# Patient Record
Sex: Male | Born: 1965 | Race: Black or African American | Hispanic: No | Smoking: Former smoker
Health system: Southern US, Community
[De-identification: ages and names within clinical notes are randomized; demographics above are authoritative.]

## PROBLEM LIST (undated history)

## (undated) DIAGNOSIS — G4733 Obstructive sleep apnea (adult) (pediatric): Secondary | ICD-10-CM

## (undated) DIAGNOSIS — I10 Essential (primary) hypertension: Secondary | ICD-10-CM

## (undated) DIAGNOSIS — I639 Cerebral infarction, unspecified: Secondary | ICD-10-CM

## (undated) HISTORY — DX: Obstructive sleep apnea (adult) (pediatric): G47.33

## (undated) HISTORY — DX: Essential (primary) hypertension: I10

## (undated) HISTORY — DX: Cerebral infarction, unspecified: I63.9

---

## 2015-01-28 ENCOUNTER — Ambulatory Visit (INDEPENDENT_AMBULATORY_CARE_PROVIDER_SITE_OTHER): Payer: Non-veteran care | Admitting: Internal Medicine

## 2015-01-28 ENCOUNTER — Ambulatory Visit (INDEPENDENT_AMBULATORY_CARE_PROVIDER_SITE_OTHER)
Admission: RE | Admit: 2015-01-28 | Discharge: 2015-01-28 | Disposition: A | Discharge status: Home / Self Care | Payer: Self-pay | Source: Ambulatory Visit | Attending: Internal Medicine | Admitting: Internal Medicine

## 2015-01-28 ENCOUNTER — Encounter: Payer: Self-pay | Admitting: Internal Medicine

## 2015-01-28 VITALS — BP 140/90 | HR 60 | Ht 69.5 in | Wt 192.2 lb

## 2015-01-28 DIAGNOSIS — D869 Sarcoidosis, unspecified: Secondary | ICD-10-CM | POA: Diagnosis not present

## 2015-01-28 DIAGNOSIS — R0600 Dyspnea, unspecified: Secondary | ICD-10-CM

## 2015-01-28 MED ORDER — PANTOPRAZOLE SODIUM 40 MG PO TBEC: 40.0000 mg | DELAYED_RELEASE_TABLET | Freq: Every day | ORAL | Status: AC

## 2015-01-28 MED ORDER — FAMOTIDINE 20 MG PO TABS: ORAL_TABLET | ORAL | Status: AC

## 2015-01-28 NOTE — Progress Notes (Signed)
Subjective:    Patient ID: Jay George, male    DOB: Oct 08, 1965,    MRN: [id number]  HPI  49 yobm quit smoking May 2016 with dx of sarcoid 1992 sob/rash never completely resolved on prednisone at maint of 20 mg  so stopped around early 2015 with nausea/vomiting which improved over a few weeks but then his breathing started getting worse > referred by [facility] to pulmonary clinic 01/28/2015 for ? Needs rehab   01/28/2015 1st [facility] office visit/ Jay George   Chief Complaint  Patient presents with  . Pulmonary Consult    [facility]. Pt states that he was dxed in 1992.  He states that he had been on pred since his dx until 1.5 yrs ago- since then having increased DOE. He is unable to play golf as much as he used to due to SOB.  He has prod cough with yellow sputum am only.    rash is not different on vs off prednisone / breathing is def worse. 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 No unusual exp hx or h/o childhood pna/ asthma or knowledge of premature birth.  Sleeping ok without nocturnal  or early am exacerbation  of respiratory  c/o's or need for noct saba. Also denies any obvious fluctuation of symptoms with weather or environmental changes or other aggravating or alleviating factors except as outlined above   Current Medications, Allergies, Complete Past Medical History, Past Surgical History, Family History, and Social History were reviewed in [employer] record.           Review of Systems  Constitutional: Positive for appetite change.  Negative for fever, chills, activity change and unexpected weight change.  HENT: Negative for congestion, dental problem, postnasal drip, rhinorrhea, sneezing, sore throat, trouble swallowing and voice change.   Eyes: Negative for visual disturbance.  Respiratory: Positive for shortness of breath. Negative for cough and choking.   Cardiovascular: Negative for chest pain and leg swelling.  Gastrointestinal: Negative for nausea, vomiting and abdominal pain.  Genitourinary: Negative for difficulty urinating.       Acid heartburn  Musculoskeletal: Negative for arthralgias.  Skin: Negative for rash.  Psychiatric/Behavioral: Negative for behavioral problems and confusion.       Objective:   Physical Exam  Wt Readings from Last 3 Encounters:  01/28/15 192 lb 3.2 oz (87.181 kg)    Vital signs reviewed   HEENT: nl dentition, turbinates, and orophanx. Nl external ear canals without cough reflex   NECK :  without JVD/Nodes/TM/ nl carotid upstrokes bilaterally   LUNGS: no acc muscle use, clear to A and P bilaterally without cough on insp or exp maneuvers   CV:  RRR  no s3 or murmur or increase in P2, no edema   ABD:  soft and nontender with nl excursion in the supine position. No bruits or organomegaly, bowel sounds nl  MS:  warm without deformities, calf tenderness, cyanosis or clubbing  SKIN: warm and dry / hypopigmented  macules over upper torso/ face spared   NEURO:  alert, approp, no deficits    CXR PA and Lateral:   01/28/2015 :     I personally reviewed images and agree with radiology impression as follows:    1. Nodular opacity in the retrosternal airspace on the lateral view. Recommend CT of the chest in view of the patient's history of smoking. 2. Somewhat nodular hila may be due to sarcoidosis. Again CT of the chest with IV contrast media would be helpful in further evaluation. 3. Moderate cardiomegaly with AICD lead.      Assessment & Plan:

## 2015-01-28 NOTE — Assessment & Plan Note (Signed)
01/28/2015  Walked RA x 3 laps @ 185 ft each stopped due to End of study, nl pace, no sob or desat    Symptoms are markedly disproportionate to objective findings and not clear this is a lung problem but pt does appear to have difficult airway management issues.  DDX of  difficult airways management all start with A and  include Adherence, Ace Inhibitors, Acid Reflux, Active Sinus Disease, Alpha 1 Antitripsin deficiency, Anxiety masquerading as Airways dz,  ABPA,  allergy(esp in young), Aspiration (esp in elderly), Adverse effects of meds,  Active smokers, A bunch of PE's (a small clot burden can't cause this syndrome unless there is already severe underlying pulm or vascular dz with poor reserve) plus two Bs  = Bronchiectasis and Beta blocker use..and one C= CHF  Adherence is always the initial "prime suspect" and is a multilayered concern that requires a "trust but verify" approach in every patient - starting with knowing how to use medications, especially inhalers, correctly, keeping up with refills and understanding the fundamental difference between maintenance and prns vs those medications only taken for a very short course and then stopped and not refilled.   ? Acid (or non-acid) GERD > always difficult to exclude as up to 75% of pts in some series report no assoc GI/ Heartburn symptoms> rec max (24h)  acid suppression and diet restrictions/ reviewed and instructions given in writing.  ? Allergy/ asthma component > not much variability to support, no better off cigs  ? Active smoking > denies since may 2016, strongly reinforced > [facility]  ? BB > Strongly prefer in this setting: Bystolic, the most beta -1  selective Beta blocker available in sample form, with bisoprolol the most selective generic choice  on the market.   ? chf > need last cards notes  I had an extended discussion with the patient reviewing all relevant studies completed to date and  lasting 37m    Each  maintenance medication was reviewed in detail including most importantly the difference between maintenance and prns and under what circumstances the prns are to be triggered using an action plan format that is not reflected in the computer generated alphabetically organized AVS.    Please see instructions for details which were reviewed in writing and the patient given a copy highlighting the part that I personally wrote and discussed at today's ov.

## 2015-01-28 NOTE — Assessment & Plan Note (Signed)
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

## 2015-01-28 NOTE — Patient Instructions (Addendum)
Fax the bloodwork report and pfts to [phone] and last cardiology note and cath report   Please remember to go to the x-ray department downstairs for your tests - we will call you with the results when they are available.  Pantoprazole (protonix) 40 mg   Take  30-60 min before first meal of the day and Pepcid (famotidine)  20 mg one @  bedtime until return to office - this is the best way to tell whether stomach acid is contributing to your problem.    GERD (REFLUX)  is an extremely common cause of respiratory symptoms just like yours , many times with no obvious heartburn at all.    It can be treated with medication, but also with lifestyle changes including elevation of the head of your bed (ideally with 6 inch  bed blocks),  Smoking cessation, avoidance of late meals, excessive alcohol, and avoid fatty foods, chocolate, peppermint, colas, red wine, and acidic juices such as orange juice.  NO MINT OR MENTHOL PRODUCTS SO NO COUGH DROPS  USE SUGARLESS CANDY INSTEAD (Jolley ranchers or Stover's or Life Savers) or even ice chips will also do - the key is to swallow to prevent all throat clearing. NO OIL BASED VITAMINS - use powdered substitutes.  Please schedule a follow up office visit in 4 weeks, sooner if needed

## 2015-02-24 ENCOUNTER — Ambulatory Visit: Payer: Non-veteran care | Admitting: Internal Medicine

## 2015-03-04 ENCOUNTER — Encounter: Payer: Self-pay | Admitting: Internal Medicine

## 2015-03-04 ENCOUNTER — Ambulatory Visit (INDEPENDENT_AMBULATORY_CARE_PROVIDER_SITE_OTHER): Payer: Non-veteran care | Admitting: Internal Medicine

## 2015-03-04 VITALS — BP 130/90 | HR 70 | Ht 69.0 in | Wt 185.0 lb

## 2015-03-04 DIAGNOSIS — D869 Sarcoidosis, unspecified: Secondary | ICD-10-CM | POA: Diagnosis not present

## 2015-03-04 DIAGNOSIS — R0600 Dyspnea, unspecified: Secondary | ICD-10-CM

## 2015-03-04 NOTE — Patient Instructions (Signed)
No change in medications  We need for you to bring to us   1) your last chest xray on a disc plus the last CT chest  2) your last lung function test 3) your last heart cath report   Please schedule a follow up office visit in 6 weeks, call sooner if needed

## 2015-03-04 NOTE — Assessment & Plan Note (Signed)
01/28/2015  Walked RA x 3 laps @ 185 ft each stopped due to End of study, nl pace, no sob or desat    Improved on rx for gerd with minimal need for rescue combivent    I reviewed the Fletcher curve with the patient that basically indicates  if you quit smoking when your best day FEV1 is still well preserved (as is apparently  the case here but [place] [facility] [place] [place])   it is highly unlikely you will progress to severe disease and informed the patient there was no medication on the market that has proven to alter the curve/ its downward trajectory  or the likelihood of progression of their disease.  Therefore stopping smoking and maintaining abstinence is the most important aspect of care, not choice of inhalers or for that matter, doctors.    I had an extended discussion with the patient reviewing all relevant studies completed to date and  lasting 15 to 20 minutes of a 25 minute visit    Each maintenance medication was reviewed in detail including most importantly the difference between maintenance and prns and under what circumstances the prns are to be triggered using an action plan format that is not reflected in the computer generated alphabetically organized AVS.    Please see instructions for details which were reviewed in writing and the patient given a copy highlighting the part that I personally wrote and discussed at today's ov.

## 2015-03-04 NOTE — Assessment & Plan Note (Signed)
Dx in 1992 - off steroids since spring 2015   [facility] and options are to [facility] with [facility] or bring the previous studies on a CD for comparison to our cxr > Discussed in detail all the  indications, usual  risks and alternatives  relative to the benefits with patient who agrees to proceed with conservative f/u as outlined

## 2015-03-04 NOTE — Progress Notes (Signed)
Subjective:    Patient ID: Jay George, male    DOB: 1965-10-02,    MRN: [id number]    Brief patient profile:  49 yobm quit smoking May 2016 with dx of sarcoid 1992 [place] [facility]  so stopped around early 2015 with nausea/vomiting which improved over a few weeks but then his breathing started getting worse > [facility] for ? Needs rehab   01/28/2015 1st [facility] office visit/ Jay George   Chief Complaint  Patient presents with  . Pulmonary Consult    [facility]. Pt states that he was dxed in 1992.  He states that he had been on pred since his dx until 1.5 yrs ago- since then having increased DOE. He is unable to play golf as much as he used to due to SOB.  He has prod cough with yellow sputum am only.    rash is not different on vs off prednisone / breathing is def worse. 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 Pantoprazole (protonix) 40 mg   Take  30-60 min before first meal of the day and Pepcid (famotidine)  20 mg one @  bedtime until return to office - this is the best way to tell whether stomach acid is contributing to your problem.   GERD diet    03/04/2015  f/u ov/Jay George re: sob/ cough better on gerd rx/ [facility] recs from [facility] Chief Complaint  Patient presents with  . Follow-up    Pt states breathing has improved some and not coughing as much.  No new co's  today.    no [facility] records yet in system  But pt much better, rarely needing combivent, mostly at work and not nocturnal  No obvious day to day or daytime variability or assoc excess/purulent mucus  or cp or chest tightness, subjective wheeze or overt sinus or hb symptoms. No unusual exp hx or h/o childhood pna/ asthma or knowledge of premature birth.  Sleeping ok without nocturnal  or early am exacerbation  of respiratory  c/o's or need for noct saba. Also denies any obvious fluctuation of symptoms with weather or environmental changes or other aggravating or alleviating factors except as outlined above   Current Medications, Allergies, Complete Past Medical History, Past Surgical History, Family History, and Social History were reviewed in [facility] electronic medical record.  ROS  The following are not active complaints unless bolded sore throat, dysphagia, dental problems, itching, sneezing,  nasal congestion or excess/ purulent secretions, ear ache,   fever, chills, sweats, unintended wt loss, classically pleuritic or exertional cp, hemoptysis,  orthopnea pnd or leg swelling, presyncope, palpitations, abdominal pain, anorexia, nausea, vomiting, diarrhea  or change in bowel or bladder habits, change in stools or urine, dysuria,hematuria,  rash, arthralgias, visual complaints, headache, numbness, weakness  or ataxia or problems with walking or coordination,  change in mood/affect or memory.                               Objective:   Physical Exam  amb bm nad  Wt Readings from Last 3 Encounters:  03/04/15 185 lb (83.915 kg)  01/28/15 192 lb 3.2 oz (87.181 kg)    Vital signs reviewed    HEENT: nl dentition, turbinates, and orophanx. Nl external ear canals without cough reflex   NECK :  without JVD/Nodes/TM/ nl carotid upstrokes bilaterally   LUNGS: no acc muscle use, clear to A and P bilaterally without cough on insp or exp maneuvers   CV:  RRR  no s3 or murmur or  increase in P2, no edema   ABD:  soft and nontender with nl excursion in the supine position. No bruits or organomegaly, bowel sounds nl  MS:  warm without deformities, calf tenderness, cyanosis or clubbing  SKIN: warm and dry / hypopigmented macules over upper torso/ face spared   NEURO:  alert, approp, no deficits    CXR PA and Lateral:   01/28/2015 :     I personally reviewed images and agree with radiology impression as follows:    1. Nodular opacity in the retrosternal airspace on the lateral view. Recommend CT of the chest in view of the patient's history of smoking. 2. Somewhat nodular hila may be due to sarcoidosis. Again CT of the chest with IV contrast media would be helpful in further evaluation. 3. Moderate cardiomegaly with AICD lead.      Assessment & Plan:

## 2015-12-23 IMAGING — CR DG CHEST 2V
2 series · 2 of 2 positions shown · non-contrast
Comparison: None.

CLINICAL DATA: Shortness of breath, history of sarcoidosis, former
smoking history

EXAM:
CHEST  2 VIEW

[view not recorded (1 of 2)]
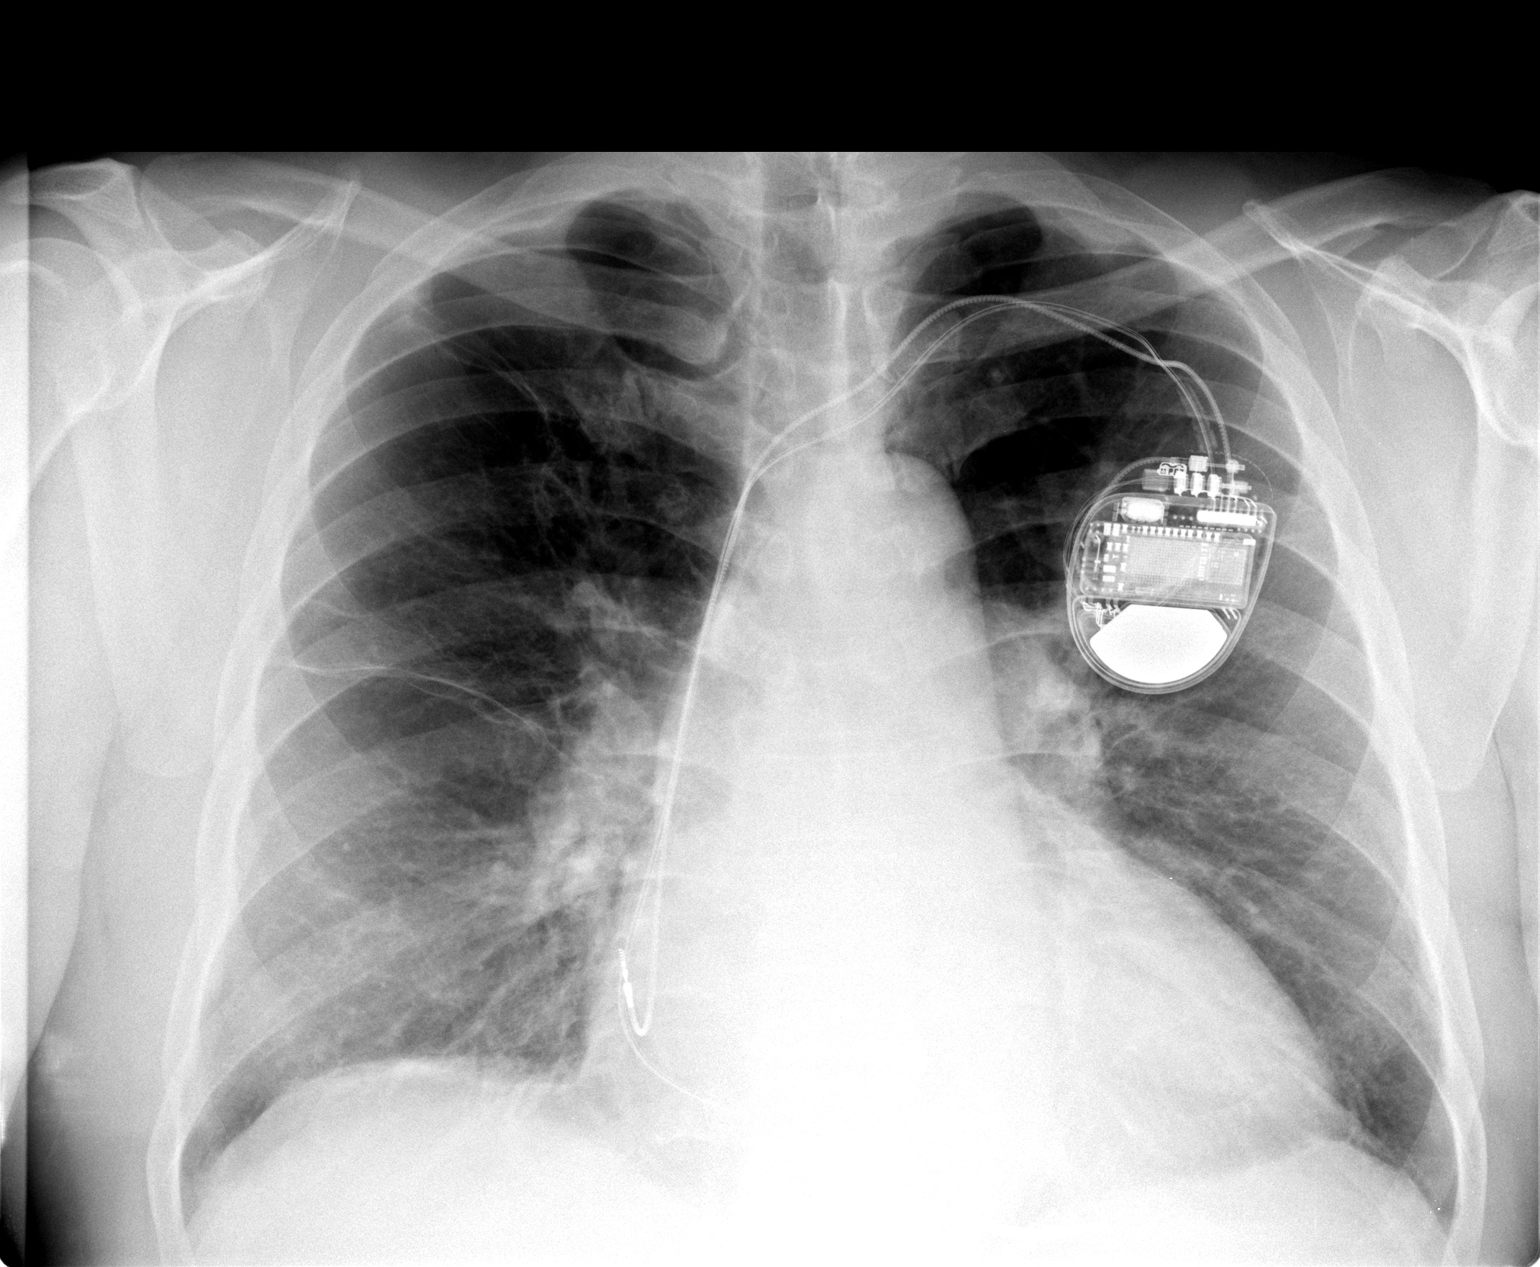

[view not recorded (2 of 2)]
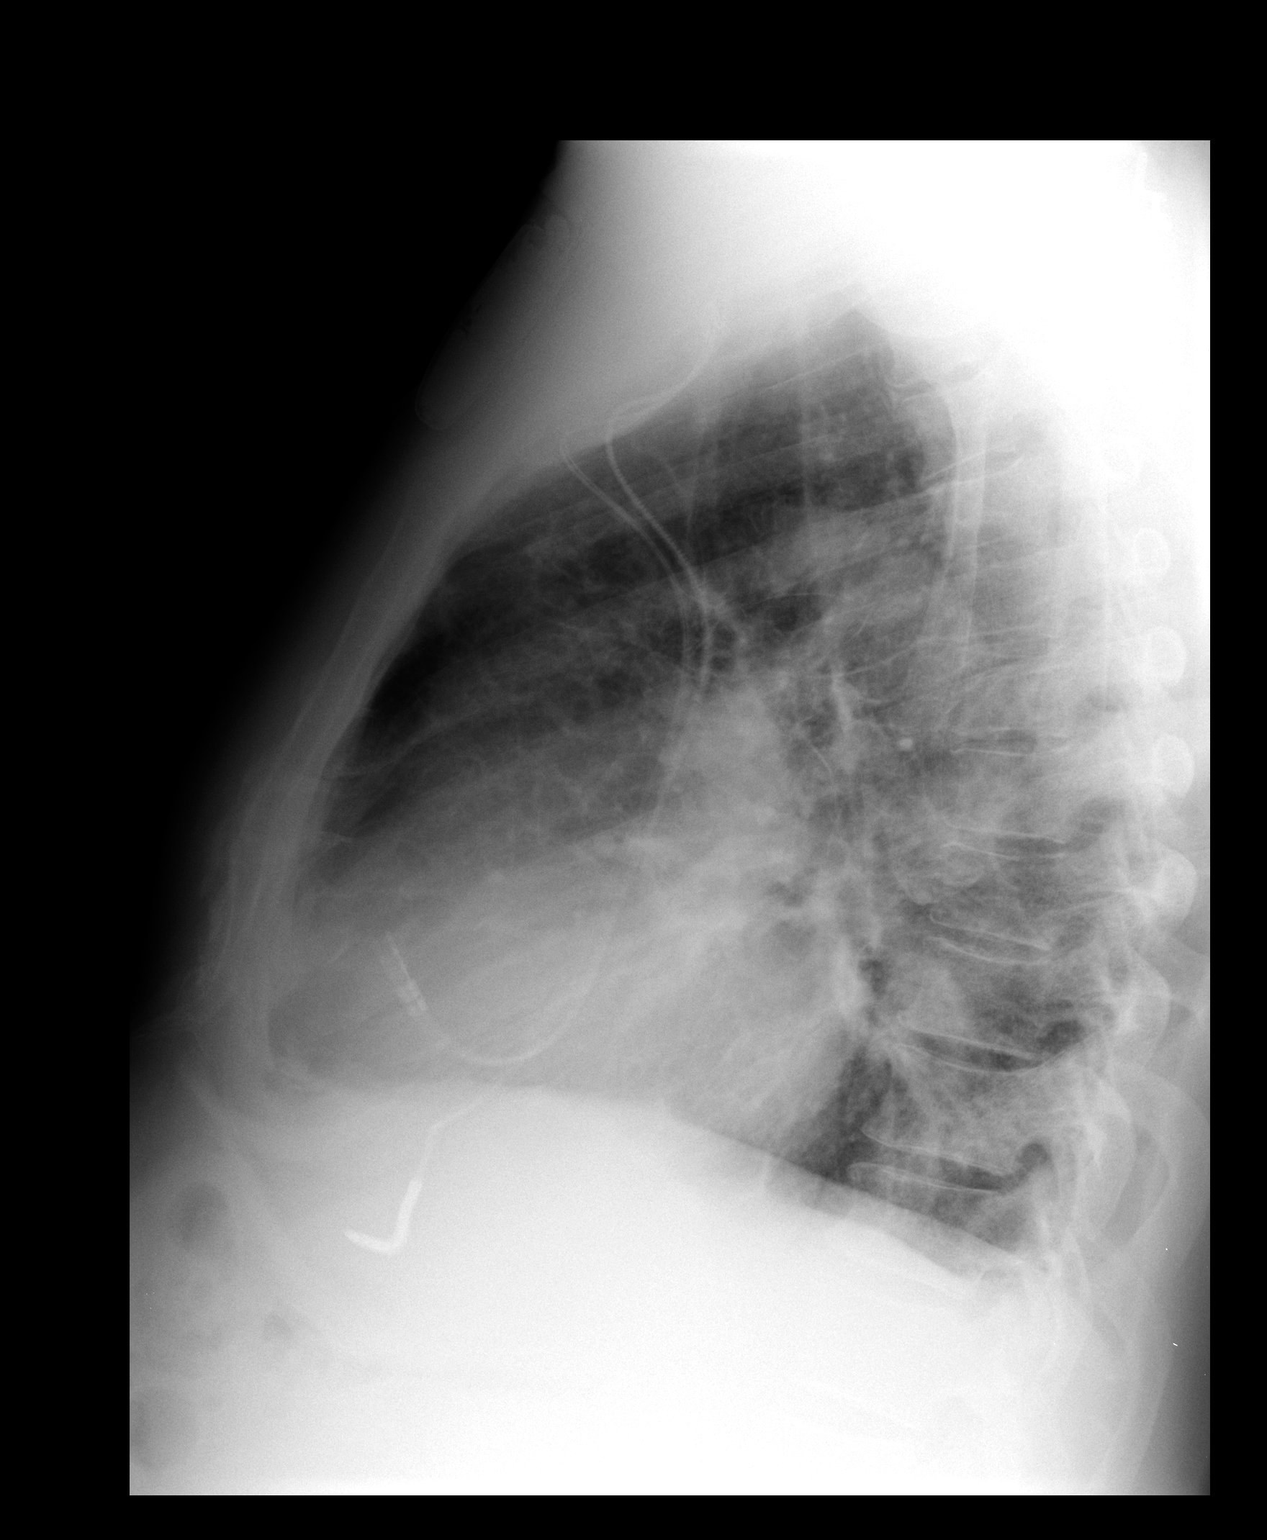

[2 of 2 positions shown; findings below may reference images not displayed]

FINDINGS: The lungs are somewhat hyper aerated. No infiltrate or effusion is
seen. On the lateral view in the retrosternal airspace there is a
poorly defined nodular opacity present. This could be due to
scarring, but CT of the chest is recommended in view of the
patient's smoking history. There is linear scarring in the right
upper lobe and in the right mid lung field. There are slightly
prominent hila present and mild adenopathy cannot be excluded in
this patient with sarcoidosis. Again CT of the chest would be
helpful in assessment of adenopathy as well. There is moderate
cardiomegaly present with an AICD lead noted. No acute bony
abnormality is seen.
IMPRESSION: 1. Nodular opacity in the retrosternal airspace on the lateral view.
Recommend CT of the chest in view of the patient's history of
smoking.
2. Somewhat nodular hila may be due to sarcoidosis. Again CT of the
chest with IV contrast media would be helpful in further evaluation.
3. Moderate cardiomegaly with AICD lead.
